# Patient Record
Sex: Male | Born: 2000 | Race: White | Hispanic: No | Marital: Single | Smoking: Never smoker
Health system: Southern US, Community
[De-identification: ages and names within clinical notes are randomized; demographics above are authoritative.]

---
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
## 2020-02-18 ENCOUNTER — Other Ambulatory Visit: Payer: Self-pay

## 2020-02-18 ENCOUNTER — Emergency Department: Payer: Self-pay

## 2020-02-18 ENCOUNTER — Encounter: Payer: Self-pay | Admitting: Emergency Medicine

## 2020-02-18 ENCOUNTER — Emergency Department
Admission: EM | Admit: 2020-02-18 | Discharge: 2020-02-18 | Disposition: A | Discharge status: Home / Self Care | Payer: Self-pay | Attending: Emergency Medicine | Admitting: Emergency Medicine

## 2020-02-18 DIAGNOSIS — Y9302 Activity, running: Secondary | ICD-10-CM | POA: Insufficient documentation

## 2020-02-18 DIAGNOSIS — S63631A Sprain of interphalangeal joint of left index finger, initial encounter: Secondary | ICD-10-CM | POA: Insufficient documentation

## 2020-02-18 DIAGNOSIS — S161XXA Strain of muscle, fascia and tendon at neck level, initial encounter: Secondary | ICD-10-CM | POA: Insufficient documentation

## 2020-02-18 DIAGNOSIS — S0990XA Unspecified injury of head, initial encounter: Secondary | ICD-10-CM

## 2020-02-18 DIAGNOSIS — S060X0A Concussion without loss of consciousness, initial encounter: Secondary | ICD-10-CM | POA: Insufficient documentation

## 2020-02-18 DIAGNOSIS — W010XXA Fall on same level from slipping, tripping and stumbling without subsequent striking against object, initial encounter: Secondary | ICD-10-CM | POA: Insufficient documentation

## 2020-02-18 MED ORDER — ACETAMINOPHEN 500 MG PO TABS: 1000.0000 mg | ORAL_TABLET | Freq: Once | ORAL | Status: DC

## 2020-02-18 NOTE — ED Notes (Signed)
Unable to find in waiting room. 

## 2020-02-18 NOTE — ED Triage Notes (Signed)
Pt presents via POV with c/o head injury after hitting head last Thursday. Pt reports he was trying to jump fence and tripped and fell and hit head. Pt also reports pain in left index finger. Pt denies LOC with fall. Pt denies use of blood thinners. Pt currently alert and oriented x4 at this time.

## 2020-02-18 NOTE — Discharge Instructions (Signed)
Please alternate Tylenol and ibuprofen as needed for pain.  Please avoid physical activity there reproduces headache.  Please rest and avoid bright lights and screens as the may exacerbate your concussion systems.  Return to the ER for any severe headaches, nausea, vomiting, worsening symptoms or urgent changes in your health.  Please follow-up with primary care provider in 1 week for recheck

## 2020-02-18 NOTE — ED Provider Notes (Signed)
[facility] EMERGENCY DEPARTMENT Provider Note   CSN: [id number] Arrival date & time: 02/18/20  1344     History Chief Complaint  Patient presents with  . Head Injury    Ethan Nixon is a 19 y.o. male presents to the emergency department for evaluation of headache.  Patient states last Thursday he was running, jumping a fence and fell and hit the top of his head.  He suffered a headache but no loss of consciousness.  Denies any nausea or vomiting.  He reports some left-sided neck pain as well as headache that was worse today while at school.  No numbness tingling radicular symptoms.  He describes some left-sided tightness and spasms.  Headache is moderate.  He is not any medications for symptoms.  Denies any blood thinners.  Denies any lower back, abdominal pain, chest pain or shortness of breath.   HPI     History reviewed. No pertinent past medical history.  There are no problems to display for this patient.   History reviewed. No pertinent surgical history.     History reviewed. No pertinent family history.  Social History   Tobacco Use  . Smoking status: Never Smoker  . Smokeless tobacco: Never Used  Substance Use Topics  . Alcohol use: Not on file  . Drug use: Not on file    Home Medications Prior to Admission medications   Not on File    Allergies    Patient has no known allergies.  Review of Systems   Review of Systems  Constitutional: Negative for fever.  Eyes: Positive for photophobia. Negative for visual disturbance.  Respiratory: Negative for shortness of breath.   Cardiovascular: Negative for chest pain and leg swelling.  Gastrointestinal: Positive for nausea. Negative for vomiting.  Musculoskeletal: Positive for neck pain. Negative for back pain and gait problem.  Skin: Negative for rash and wound.  Neurological: Positive for headaches. Negative for seizures, light-headedness and numbness.    Physical Exam Updated  Vital Signs BP 122/77   Pulse 72   Temp 98.2 F (36.8 C) (Oral)   Resp 15   Ht 5\' 8"  (1.727 m)   Wt 70.3 kg   SpO2 100%   BMI 23.57 kg/m   Physical Exam Constitutional:      Appearance: He is well-developed.  HENT:     Head: Normocephalic and atraumatic.     Nose: Nose normal.  Eyes:     Extraocular Movements: Extraocular movements intact.     Conjunctiva/sclera: Conjunctivae normal.     Pupils: Pupils are equal, round, and reactive to light.  Cardiovascular:     Rate and Rhythm: Normal rate.  Pulmonary:     Effort: Pulmonary effort is normal. No respiratory distress.  Musculoskeletal:        General: Normal range of motion.     Cervical back: Normal range of motion.     Comments: No cervical spinous process tenderness or paravertebral tenderness. Normal ROM of BUE. NO neruo deficits in BUE.  Left hand with no swelling warmth erythema.  Mild tenderness to the left index finger PIP joint.  No tendon deficits noted.  No skin breakdown noted.  Skin:    General: Skin is warm.     Findings: No rash.  Neurological:     General: No focal deficit present.     Mental Status: He is alert and oriented to person, place, and time. Mental status is at baseline.     Cranial Nerves: No cranial  nerve deficit.     Motor: No weakness.     Gait: Gait normal.  Psychiatric:        Mood and Affect: Mood normal.        Behavior: Behavior normal.        Thought Content: Thought content normal.     ED Results / Procedures / Treatments   Labs (all labs ordered are listed, but only abnormal results are displayed) Labs Reviewed - No data to display  EKG None  Radiology DG Cervical Spine Complete  Result Date: 02/18/2020 CLINICAL DATA:  Fall.  Neck pain EXAM: CERVICAL SPINE - COMPLETE 4+ VIEW COMPARISON:  None. FINDINGS: There is no evidence of cervical spine fracture or prevertebral soft tissue swelling. Alignment is normal. No other significant bone abnormalities are identified.  IMPRESSION: Negative cervical spine radiographs. Electronically Signed   By: Marlan Palau M.D.   On: 02/18/2020 16:58   CT Head Wo Contrast  Result Date: 02/18/2020 CLINICAL DATA:  Recent fall with headaches, initial encounter EXAM: CT HEAD WITHOUT CONTRAST TECHNIQUE: Contiguous axial images were obtained from the base of the skull through the vertex without intravenous contrast. COMPARISON:  None. FINDINGS: Brain: No evidence of acute infarction, hemorrhage, hydrocephalus, extra-axial collection or mass lesion/mass effect. Vascular: No hyperdense vessel or unexpected calcification. Skull: Normal. Negative for fracture or focal lesion. Sinuses/Orbits: No acute finding. Other: None. IMPRESSION: No acute intracranial abnormality noted. Electronically Signed   By: Alcide Clever M.D.   On: 02/18/2020 17:02   DG Finger Index Left  Result Date: 02/18/2020 CLINICAL DATA:  Fall.  Finger pain EXAM: LEFT INDEX FINGER 2+V COMPARISON:  None. FINDINGS: There is no evidence of fracture or dislocation. There is no evidence of arthropathy or other focal bone abnormality. Soft tissues are unremarkable. IMPRESSION: Negative. Electronically Signed   By: Marlan Palau M.D.   On: 02/18/2020 16:58    Procedures Procedures (including critical care time)  Medications Ordered in ED Medications  acetaminophen (TYLENOL) tablet 1,000 mg (has no administration in time range)    ED Course  I have reviewed the triage vital signs and the nursing notes.  Pertinent labs & imaging results that were available during my care of the patient were reviewed by me and considered in my medical decision making (see chart for details).    MDM Rules/Calculators/A&P                          19 year old male with head injury, concussion and left-sided neck pain with left hand pain after a fall.  CT scan of the head negative.  X-rays of the neck and left index finger negative for any acute fracture.  He has no neurological deficits  or tendon deficits on exam.  Patient educated on concussion symptoms and protocols.  He understands signs symptoms return to ER for. Final Clinical Impression(s) / ED Diagnoses Final diagnoses:  Injury of head, initial encounter  Concussion without loss of consciousness, initial encounter  Strain of neck muscle, initial encounter  Sprain of interphalangeal joint of left index finger, initial encounter    Rx / DC Orders ED Discharge Orders    None       Ronnette Juniper 02/18/20 1745    Chesley Noon, MD 02/18/20 2033

## 2022-01-13 ENCOUNTER — Ambulatory Visit (INDEPENDENT_AMBULATORY_CARE_PROVIDER_SITE_OTHER): Payer: Self-pay | Admitting: Medical

## 2022-01-13 ENCOUNTER — Other Ambulatory Visit: Payer: Self-pay

## 2022-01-13 ENCOUNTER — Encounter: Payer: Self-pay | Admitting: Medical

## 2022-01-13 VITALS — BP 98/60 | HR 70 | Temp 98.2°F | Ht 69.29 in | Wt 158.0 lb

## 2022-01-13 DIAGNOSIS — L853 Xerosis cutis: Secondary | ICD-10-CM | POA: Diagnosis not present

## 2022-01-13 NOTE — Progress Notes (Signed)
[facility] [address] [place], [place] [address] Phone: [phone] Fax: [phone]   Office Visit Note  Patient Name: Ethan Nixon  Date of Birth:03-Aug-2000  Med Rec number [id number]  Date of Service: 01/16/2022  Patient has no known allergies.  Chief Complaint  Patient presents with   Rash     HPI Began to note dryness to palmar surface of fingers and palms of both hands about a week ago. Not itchy, painful or burning. Has not had this before. Treated about a month ago for athlete's foot on both feet. Used topical cream BID, has not used this in last 2 weeks, washed hands after use. Denies unusual amount of sweating to hands. No other new products. Does not wash hands excessively or with harsh soaps.   Has been using Nivea moisturizer about once a day before bed last few days, has not helped much.   Patient notes he had COVID dx in last 7-10 days, states symptoms were mild and have resolved. Denies frequent use of hand sanitizer.  Current Medication:  No outpatient encounter medications on file as of 01/13/2022.   No facility-administered encounter medications on file as of 01/13/2022.      Medical History: History reviewed. No pertinent past medical history.   Vital Signs: BP 98/60   Pulse 70   Temp 98.2 F (36.8 C) (Tympanic)   Ht 5' 9.29" (1.76 m)   Wt 158 lb (71.7 kg)   SpO2 99%   BMI 23.14 kg/m    Review of Systems  Constitutional:  Negative for chills, fatigue and fever.       Recent COVID-19 diagnosis (~7-10 days ago).  Skin:        Dry, flaky skin to both hands. No skin discoloration, pain or itching.  Neurological:  Negative for numbness.    Physical Exam Vitals reviewed.  Constitutional:      General: He is not in acute distress.    Appearance: He is not ill-appearing.  Skin:    Capillary Refill: Capillary refill takes less than 2 seconds.     Nails: There is no clubbing.     Comments: Palmar surfaces of fingers with mild  dryness and scaling. No significant erythema or other discoloration. No skin lesions. Overall skin color and temperature to hands/fingers within normal limits.  Neurological:     Mental Status: He is alert.     Assessment/Plan: 1. Dry skin May be related to recent COVID-19 diagnosis. Overall, symptoms fairly mild at this point. Encouraged to continue applying moisturizer few times/day, after washing hands if possible. Avoid excessive handwashing or hand-sanitizing.  Send my chart message to provider or schedule return visit as needed for new/worsening symptoms (i.e. skin discoloration, pain or itching) or if symptoms do not improve as discussed with recommended treatment over next 7 days.      General Counseling: Collins verbalizes understanding of the findings of todays visit and agrees with plan of treatment. I have discussed any further diagnostic evaluation that may be needed or ordered today. We also reviewed his medications today. he has been encouraged to call the office with any questions or concerns that should arise related to todays visit.   No orders of the defined types were placed in this encounter.   No orders of the defined types were placed in this encounter.   Time spent: 15 Minutes    Rosela Supak [employer] PA-C [facility] 01/16/2022 4:55 PM

## 2022-01-16 ENCOUNTER — Encounter: Payer: Self-pay | Admitting: Medical

## 2022-01-16 NOTE — Patient Instructions (Signed)
Continue applying moisturizer few times/day, after washing hands if possible. Avoid excessive handwashing or hand-sanitizing.  Send MyChart message to provider or schedule return visit as needed for new/worsening symptoms (i.e. skin discoloration, pain or itching) or if symptoms do not improve as discussed over next 7 days.

## 2022-08-16 ENCOUNTER — Ambulatory Visit (INDEPENDENT_AMBULATORY_CARE_PROVIDER_SITE_OTHER): Payer: 59 | Admitting: Adult Health

## 2022-08-16 ENCOUNTER — Encounter: Payer: Self-pay | Admitting: Adult Health

## 2022-08-16 VITALS — BP 126/72 | HR 67 | Temp 98.0°F | Wt 154.0 lb

## 2022-08-16 DIAGNOSIS — S6990XA Unspecified injury of unspecified wrist, hand and finger(s), initial encounter: Secondary | ICD-10-CM | POA: Diagnosis not present

## 2022-08-16 NOTE — Progress Notes (Signed)
[facility] [address] [place], [place] [address] Phone: [phone] Fax: [phone]   Office Visit Note  Patient Name: Ethan Nixon  Date of Birth:January 29, 2001  Med Rec number [id number]  Date of Service: 08/16/2022  Patient has no known allergies.  Chief Complaint  Patient presents with   Injury    Left pinky finger     Injury    Patient reports he was playing soccer over the weekend (2days ago).  He jumped up for a ball, and lost his balance.  He landed on his left hand, and his little finger is swollen, and painful.  He denies hearing or feeling a pop.  He was able to move it initially.  He buddy taped it to the next finger.  He took ibuprofen, which he isn't sure if he helped.  [name].  Current Medication:  No outpatient encounter medications on file as of 08/16/2022.   No facility-administered encounter medications on file as of 08/16/2022.      Medical History: History reviewed. No pertinent past medical history.   Vital Signs: BP 126/72   Pulse 67   Temp 98 F (36.7 C) (Tympanic)   Wt 154 lb (69.9 kg)   SpO2 99%   BMI 22.55 kg/m    Review of Systems  Constitutional:  Negative for fatigue and fever.  Musculoskeletal:  Positive for joint swelling.    Physical Exam Vitals and nursing note reviewed.  Constitutional:      Appearance: Normal appearance.  Musculoskeletal:       Hands:     Comments: Swelling/cyanosis noted to left 5th digit.  Exquisitely tender at PIP joint. Skin normothermic, ROM decreased.   Neurological:     Mental Status: He is alert.     Assessment/Plan: 1. Finger injury, initial encounter Based on mechanism of injury and exam, encouraged patient to go to [facility] for evaluation.  Suspicious for Fracture/dislocation.       General Counseling: [name] verbalizes understanding of the findings of todays visit and agrees with plan of treatment. I have discussed any further diagnostic evaluation that  may be needed or ordered today. We also reviewed his medications today. he has been encouraged to call the office with any questions or concerns that should arise related to todays visit.   No orders of the defined types were placed in this encounter.   No orders of the defined types were placed in this encounter.   Time spent:20 Minutes Time spent includes review of chart, medications, test results, and follow up plan with the patient.    Johnna Acosta AGNP-C Nurse Practitioner

## 2022-08-19 ENCOUNTER — Ambulatory Visit: Payer: Self-pay

## 2022-08-19 ENCOUNTER — Other Ambulatory Visit: Payer: Self-pay | Admitting: Orthopedic Surgery

## 2022-08-19 DIAGNOSIS — S62627A Displaced fracture of medial phalanx of left little finger, initial encounter for closed fracture: Secondary | ICD-10-CM

## 2022-08-20 ENCOUNTER — Ambulatory Visit
Admission: RE | Admit: 2022-08-20 | Discharge: 2022-08-20 | Disposition: A | Discharge status: Home / Self Care | Payer: 59 | Source: Ambulatory Visit | Attending: Orthopedic Surgery | Admitting: Orthopedic Surgery

## 2022-08-20 DIAGNOSIS — S62627A Displaced fracture of medial phalanx of left little finger, initial encounter for closed fracture: Secondary | ICD-10-CM | POA: Insufficient documentation

## 2022-10-18 IMAGING — CR DG CERVICAL SPINE COMPLETE 4+V
5 series · 5 of 5 positions shown · non-contrast
Comparison: None.

CLINICAL DATA: Fall.  Neck pain

EXAM:
CERVICAL SPINE - COMPLETE 4+ VIEW

[c-spine lat]
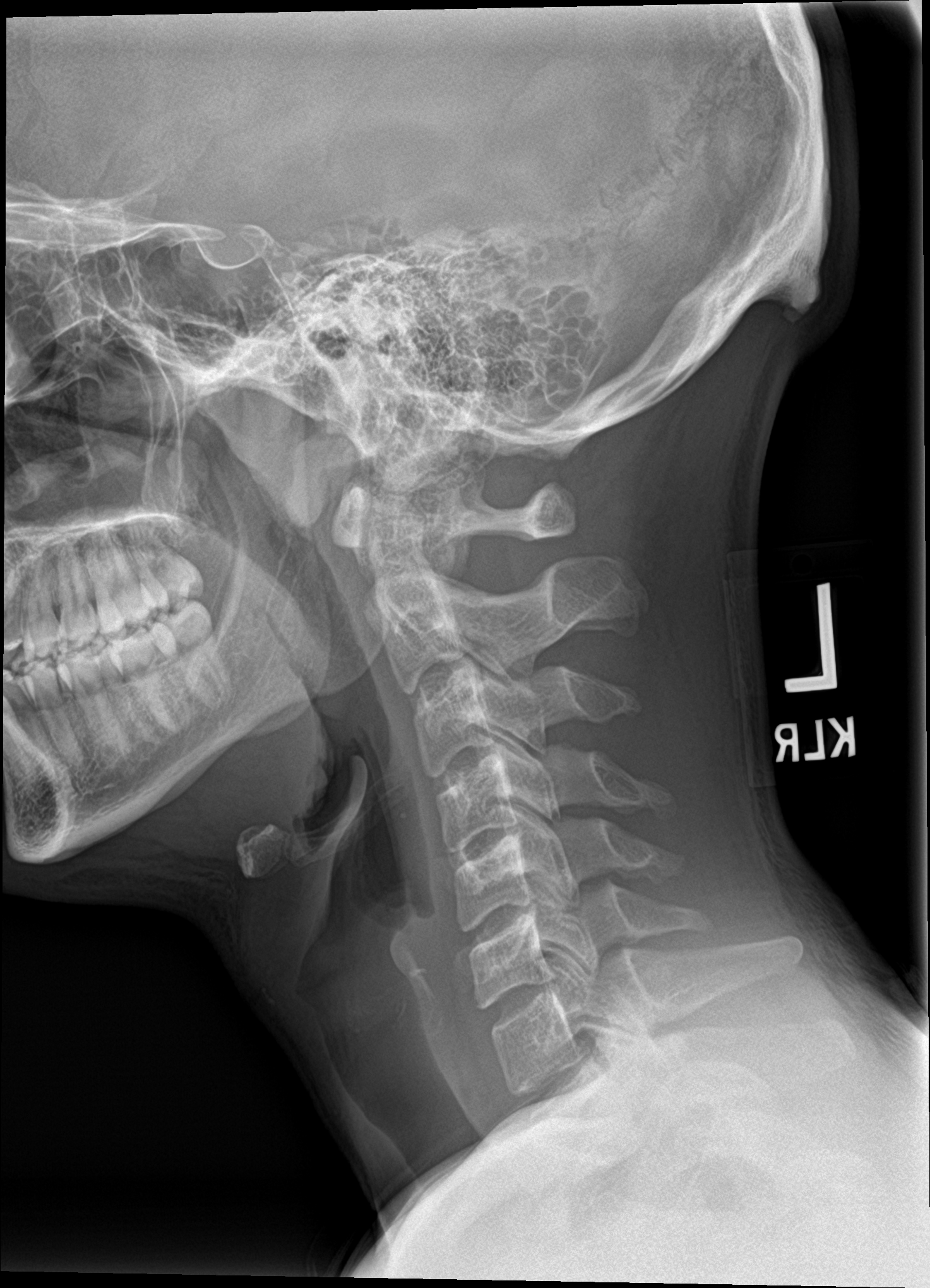

[c-spine obl (1 of 2)]
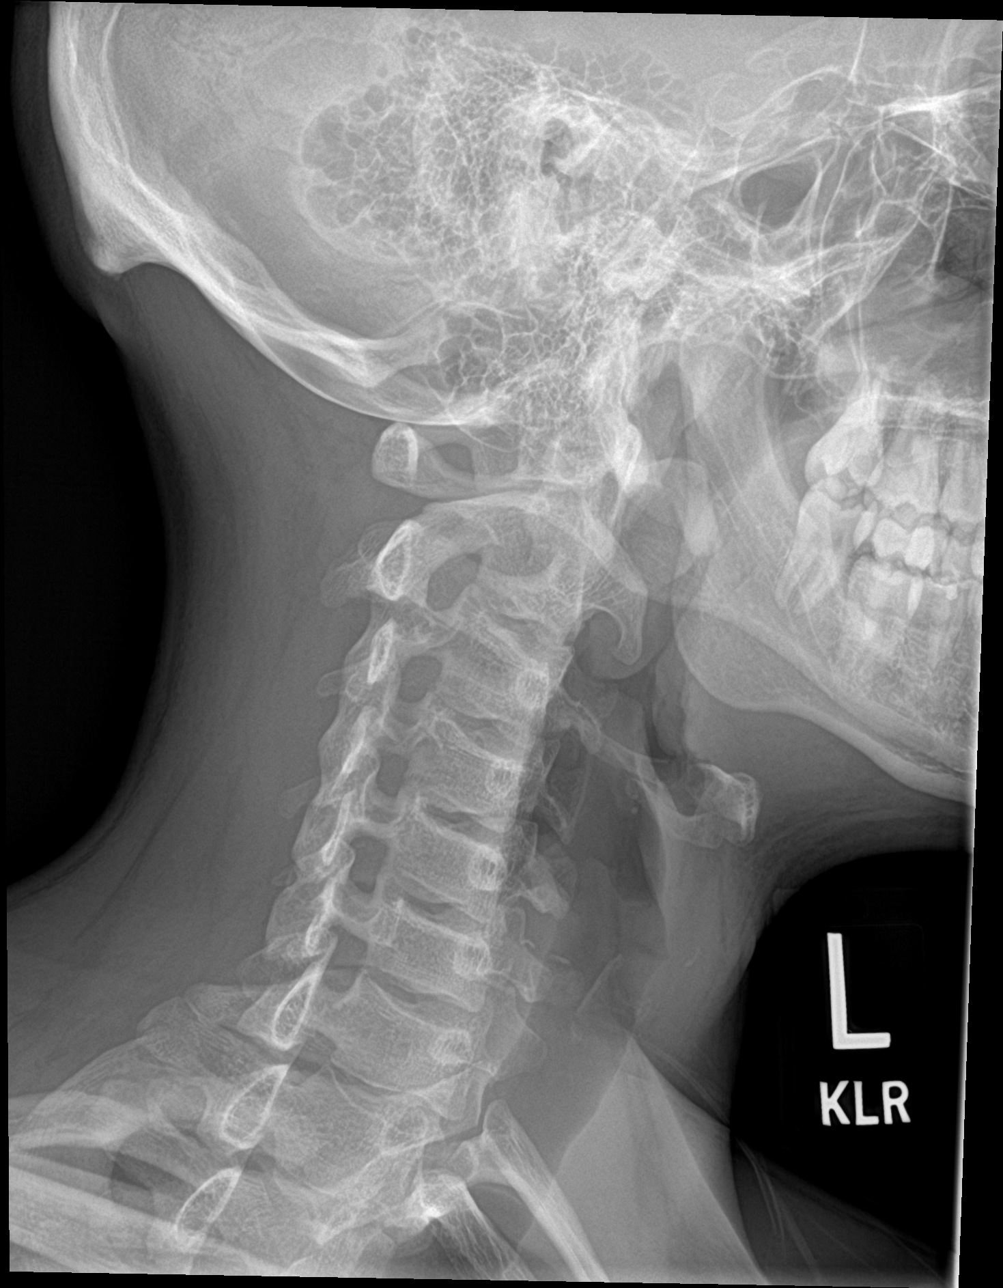

[c-spine obl (2 of 2)]
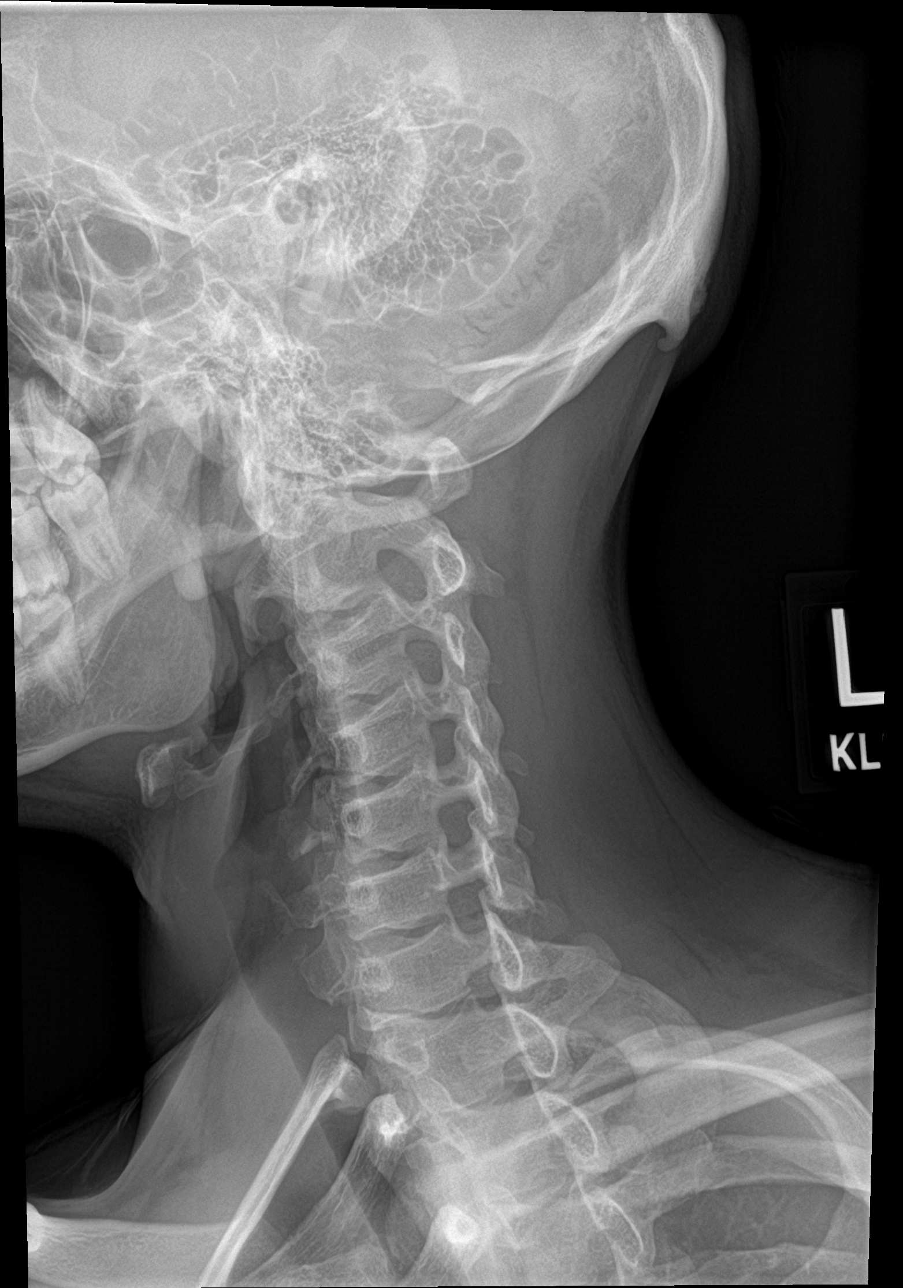

[c-spine ap]
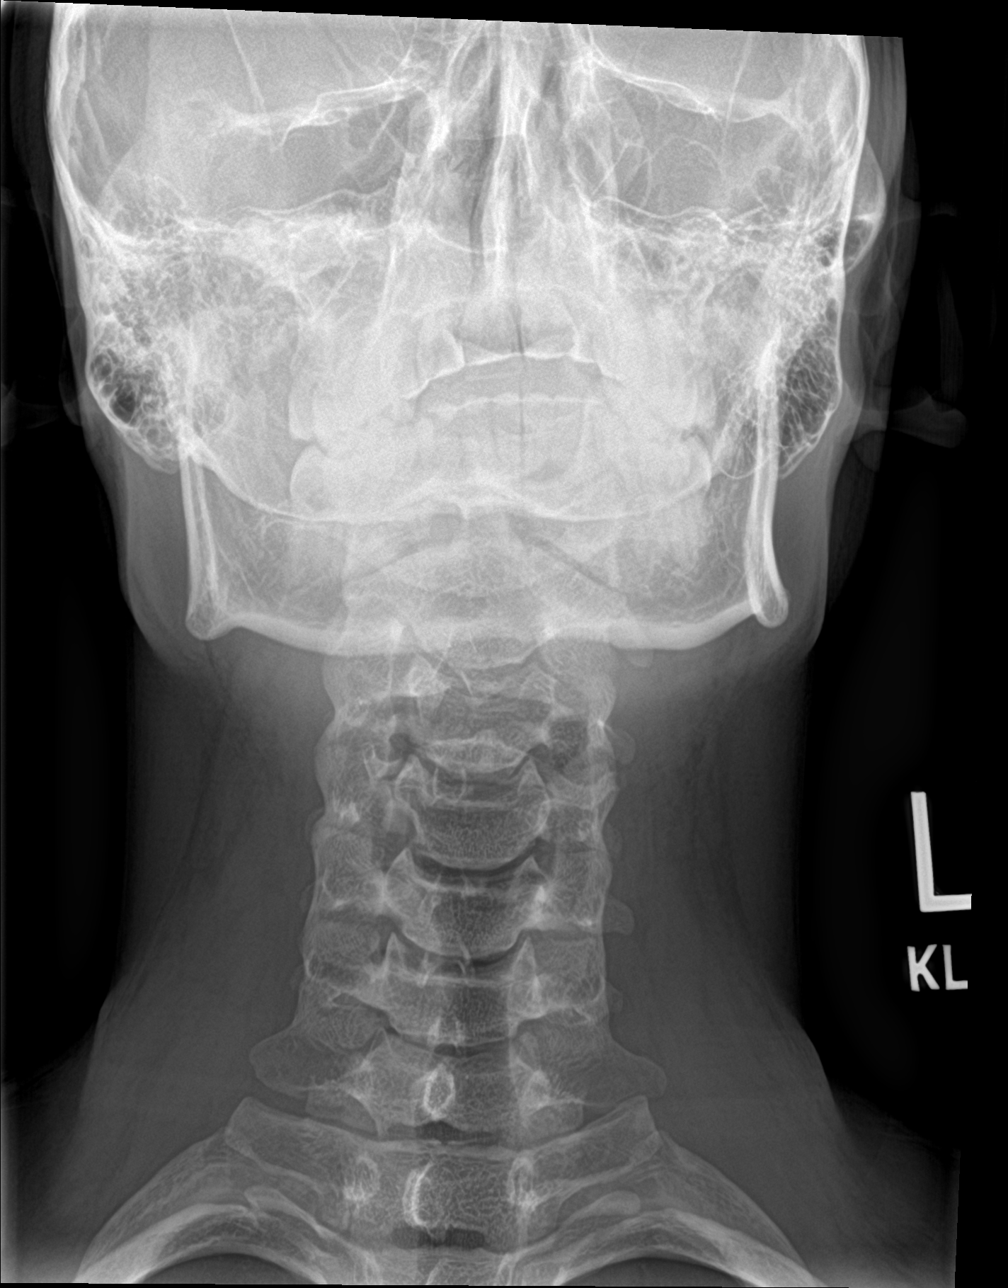

[c-spine open mouth]
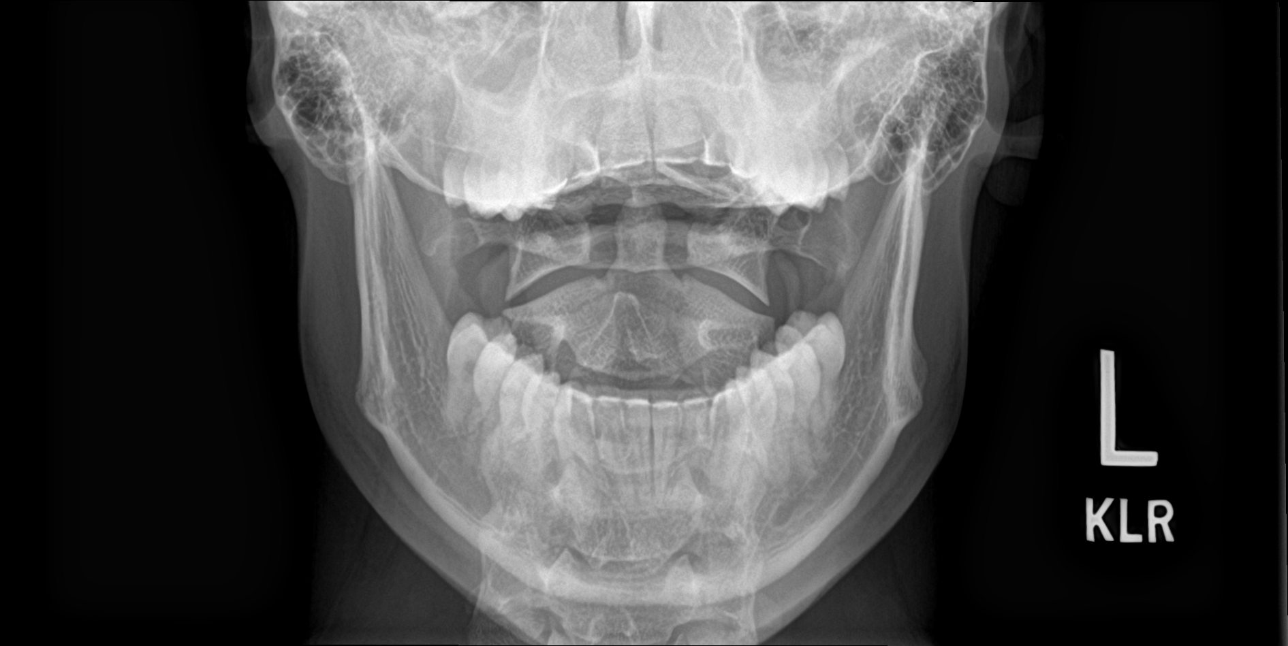

[5 of 5 positions shown; findings below may reference images not displayed]

FINDINGS: There is no evidence of cervical spine fracture or prevertebral soft
tissue swelling. Alignment is normal. No other significant bone
abnormalities are identified.
IMPRESSION: Negative cervical spine radiographs.
